# Patient Record
Sex: Male | Born: 1955 | Race: White | Hispanic: No | Marital: Single | Smoking: Former smoker
Health system: Southern US, Community
[De-identification: ages and names within clinical notes are randomized; demographics above are authoritative.]

## PROBLEM LIST (undated history)

## (undated) DIAGNOSIS — R12 Heartburn: Secondary | ICD-10-CM

## (undated) DIAGNOSIS — X58XXXA Exposure to other specified factors, initial encounter: Secondary | ICD-10-CM

## (undated) DIAGNOSIS — I10 Essential (primary) hypertension: Secondary | ICD-10-CM

## (undated) DIAGNOSIS — E785 Hyperlipidemia, unspecified: Secondary | ICD-10-CM

## (undated) HISTORY — DX: Heartburn: R12

## (undated) HISTORY — DX: Essential (primary) hypertension: I10

## (undated) HISTORY — DX: Hyperlipidemia, unspecified: E78.5

## (undated) HISTORY — DX: Exposure to other specified factors, initial encounter: X58.XXXA

---

## 2007-03-20 ENCOUNTER — Emergency Department: Payer: Self-pay | Admitting: Emergency Medicine

## 2010-12-25 ENCOUNTER — Ambulatory Visit: Payer: Self-pay | Admitting: Family Medicine

## 2011-01-01 ENCOUNTER — Ambulatory Visit: Payer: Self-pay | Admitting: Family Medicine

## 2011-09-04 IMAGING — US CV
1 series · 14 of 16 positions shown · non-contrast
Comparison: none

REASON FOR EXAM: syncope
COMMENTS:

[Series 1: cv · 0.07mm/px · 14 of 57 slices shown]
[im 1/57]
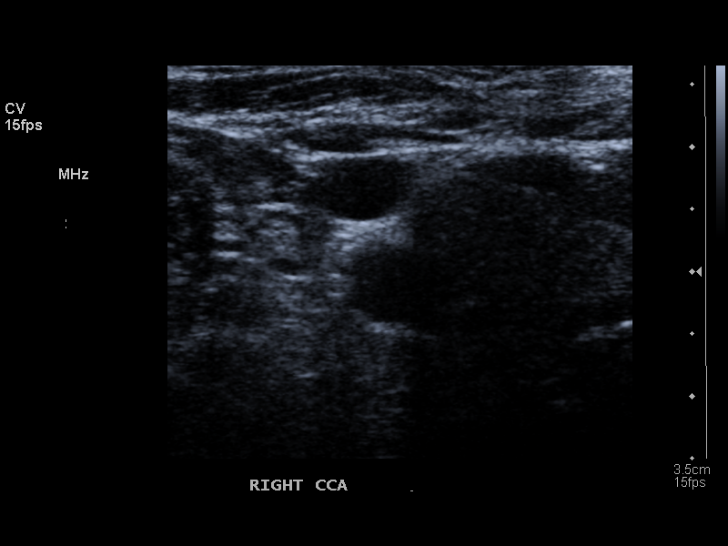
[im 4/57]
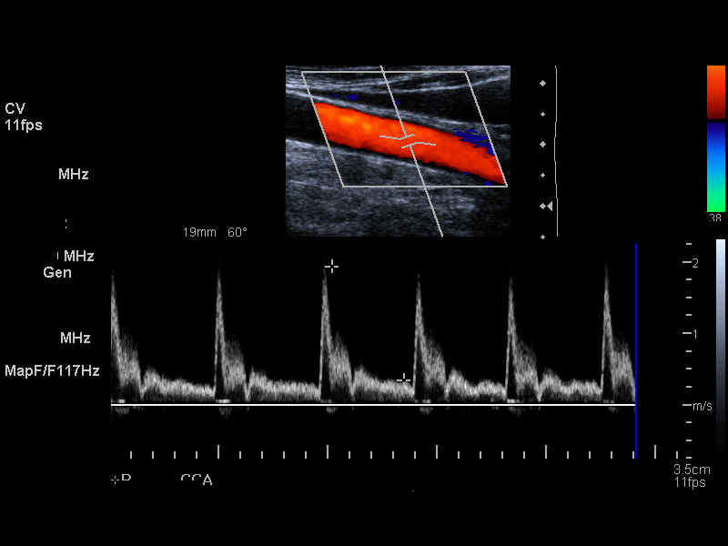
[im 8/57]
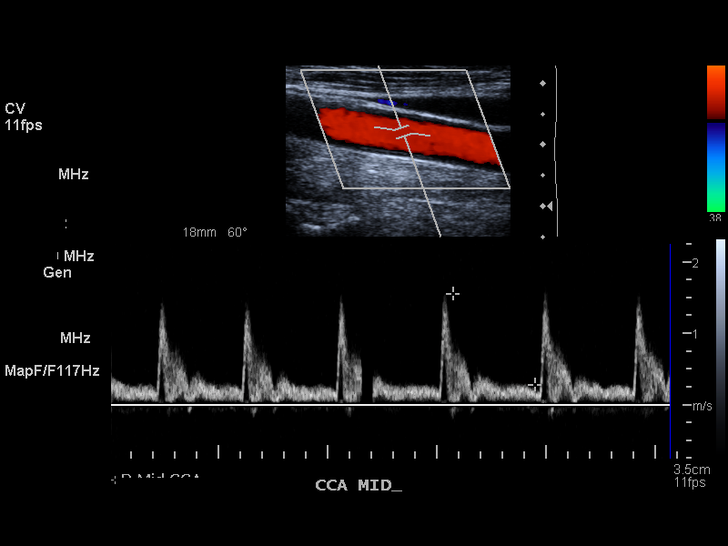
[im 15/57]
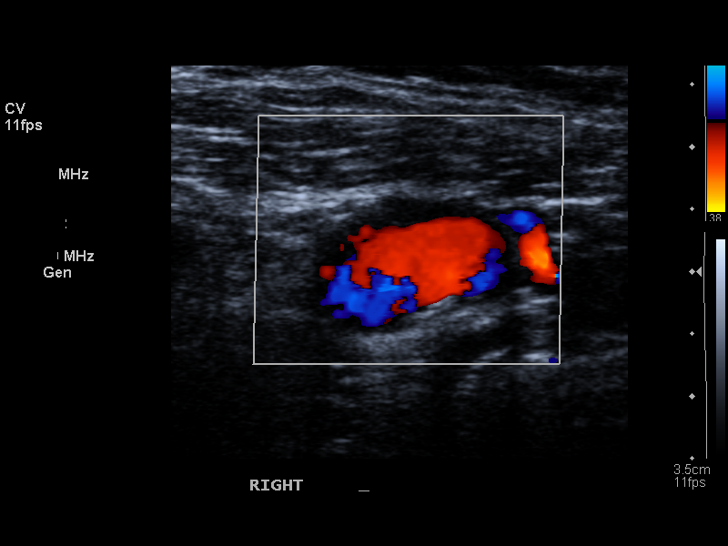
[im 19/57]
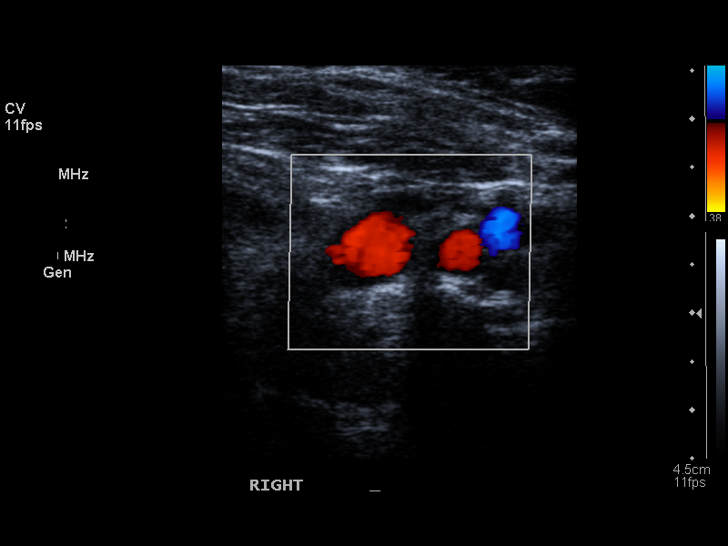
[im 23/57]
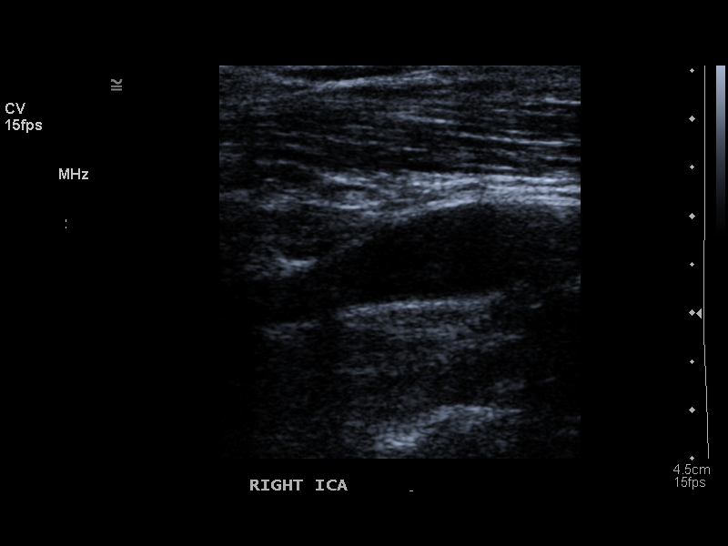
[im 27/57]
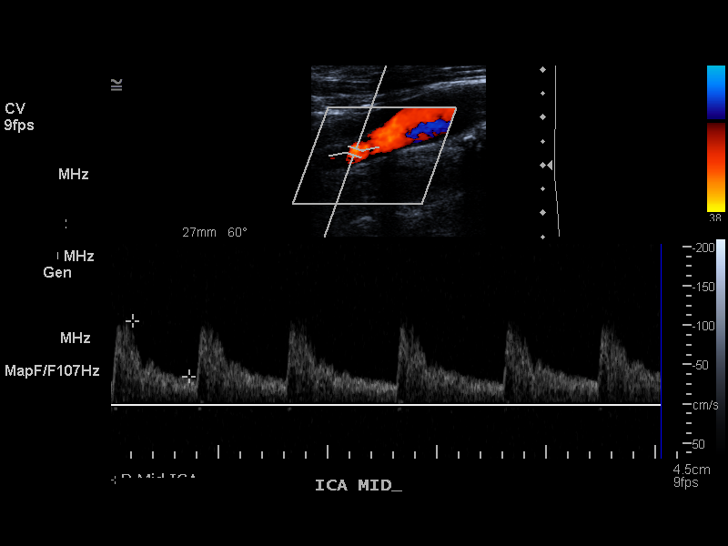
[im 30/57]
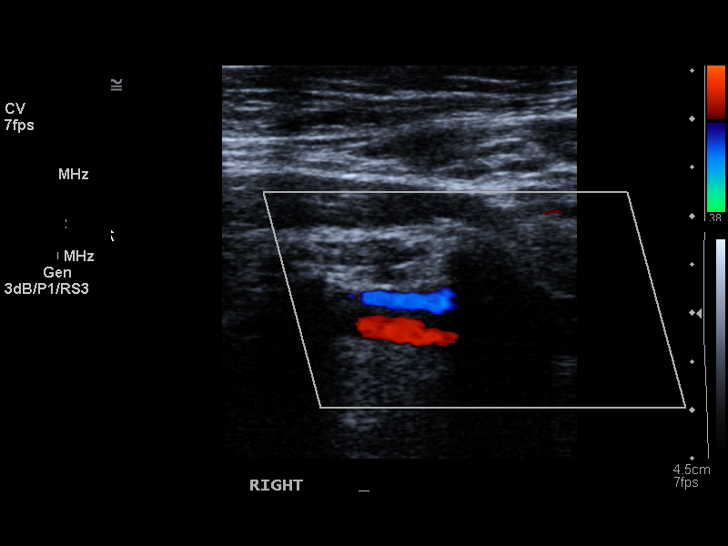
[im 34/57]
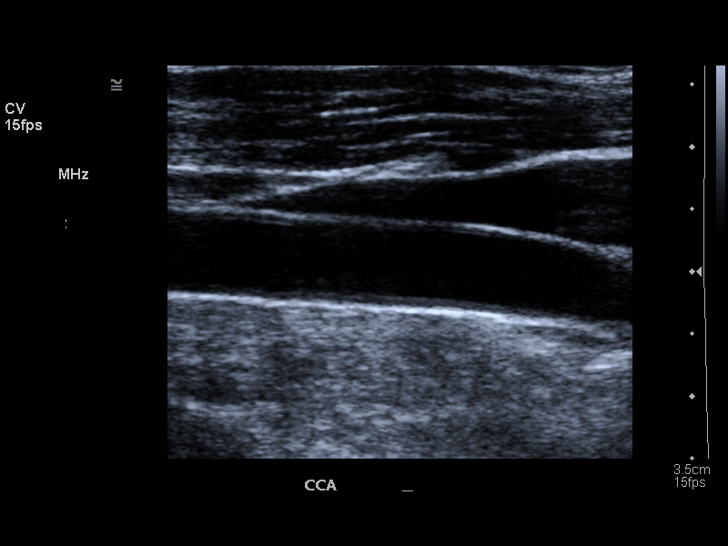
[im 38/57]
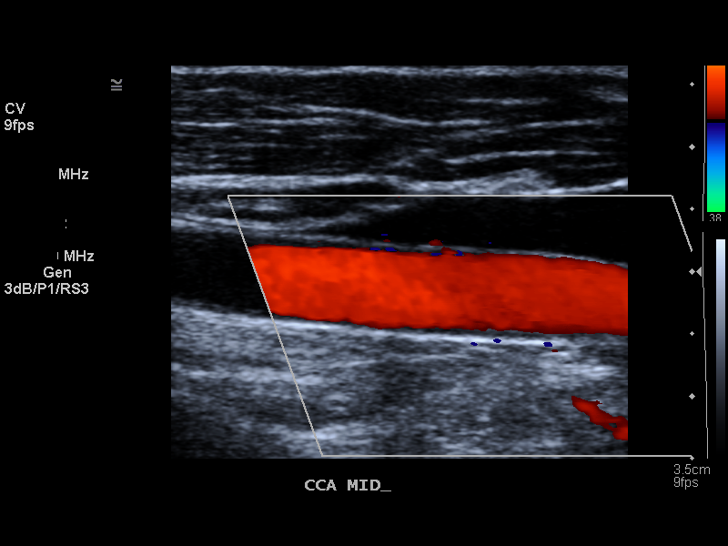
[im 45/57]
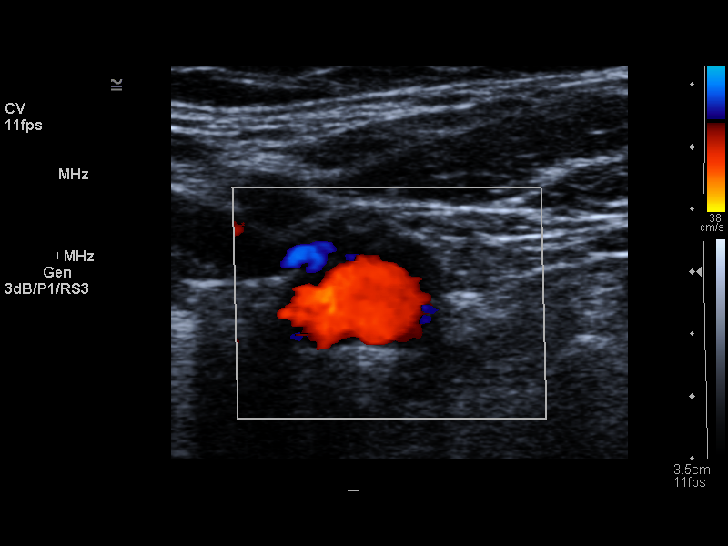
[im 49/57]
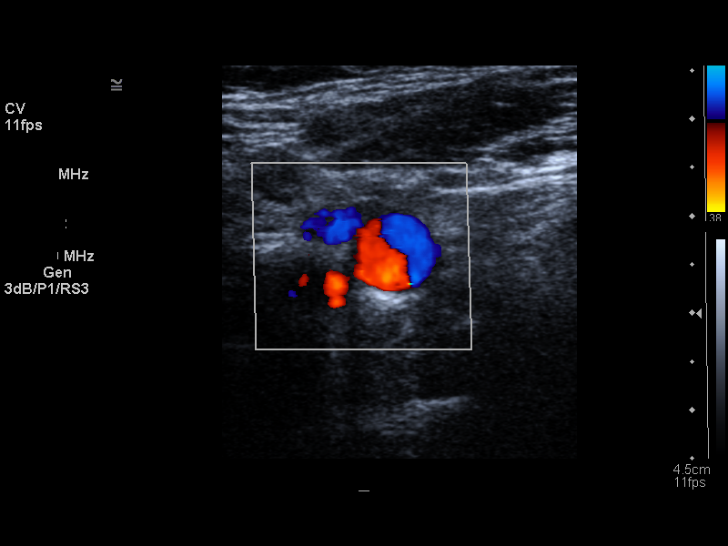
[im 53/57]
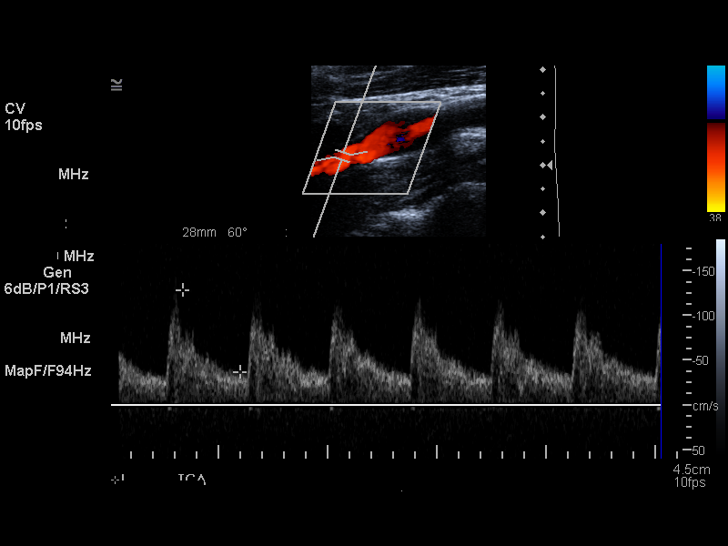
[im 57/57]
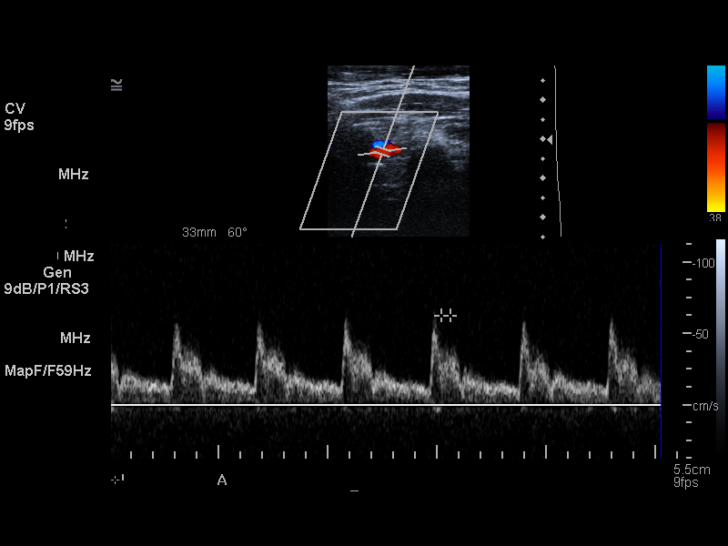

[14 of 16 positions shown; findings below may reference images not displayed]

PROCEDURE:     US  - US CAROTID DOPPLER BILATERAL  - December 25, 2010 [DATE]

RESULT:     There is slight calcific plaque formation at the carotid
bifurcation on the right. No plaque formation is seen on the left. On the
right, the peak right common carotid artery flow velocity measures 193 cm
per second and the peak right internal carotid artery flow velocity measures
133.2 cm per second. The ICA/CCA ratio is 0.69. On the left, the peak left
common carotid artery flow velocity measures 157.6 cm per second and the
peak left internal carotid artery flow velocity measures 128.5 cm per
second. The ICA/CCA ratio is 0.82. These values bilaterally are consistent
with the absence of hemodynamically significant stenosis.

There is observed antegrade flow in both vertebrals.
IMPRESSION: 1. No hemodynamically significant stenosis is identified on either side.
2. There is antegrade flow in both vertebrals.
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
## 2011-09-22 DIAGNOSIS — X58XXXA Exposure to other specified factors, initial encounter: Secondary | ICD-10-CM

## 2011-09-22 HISTORY — DX: Exposure to other specified factors, initial encounter: X58.XXXA

## 2015-04-22 ENCOUNTER — Encounter: Payer: Self-pay | Admitting: Family Medicine

## 2015-04-22 ENCOUNTER — Ambulatory Visit (INDEPENDENT_AMBULATORY_CARE_PROVIDER_SITE_OTHER): Payer: BLUE CROSS/BLUE SHIELD | Admitting: Family Medicine

## 2015-04-22 VITALS — BP 138/81 | HR 79 | Resp 16 | Ht 62.0 in | Wt 169.8 lb

## 2015-04-22 DIAGNOSIS — I152 Hypertension secondary to endocrine disorders: Secondary | ICD-10-CM | POA: Insufficient documentation

## 2015-04-22 DIAGNOSIS — E785 Hyperlipidemia, unspecified: Secondary | ICD-10-CM

## 2015-04-22 DIAGNOSIS — E1159 Type 2 diabetes mellitus with other circulatory complications: Secondary | ICD-10-CM | POA: Insufficient documentation

## 2015-04-22 DIAGNOSIS — R12 Heartburn: Secondary | ICD-10-CM | POA: Diagnosis not present

## 2015-04-22 DIAGNOSIS — I10 Essential (primary) hypertension: Secondary | ICD-10-CM

## 2015-04-22 DIAGNOSIS — E119 Type 2 diabetes mellitus without complications: Secondary | ICD-10-CM | POA: Diagnosis not present

## 2015-04-22 DIAGNOSIS — E1169 Type 2 diabetes mellitus with other specified complication: Secondary | ICD-10-CM | POA: Insufficient documentation

## 2015-04-22 MED ORDER — AMLODIPINE BESYLATE 5 MG PO TABS: 5.0000 mg | ORAL_TABLET | Freq: Every day | ORAL | Status: DC

## 2015-04-22 MED ORDER — BENAZEPRIL HCL 40 MG PO TABS: 40.0000 mg | ORAL_TABLET | Freq: Every day | ORAL | Status: DC

## 2015-04-22 MED ORDER — GEMFIBROZIL 600 MG PO TABS: 600.0000 mg | ORAL_TABLET | Freq: Two times a day (BID) | ORAL | Status: DC

## 2015-04-22 MED ORDER — ATORVASTATIN CALCIUM 10 MG PO TABS: 10.0000 mg | ORAL_TABLET | Freq: Every day | ORAL | Status: DC

## 2015-04-22 MED ORDER — METFORMIN HCL 1000 MG PO TABS
1000.0000 mg | ORAL_TABLET | Freq: Two times a day (BID) | ORAL | Status: DC
Start: 2015-04-22 — End: 2021-09-23

## 2015-04-22 NOTE — Progress Notes (Signed)
Name: Jeffrey Mcdonald   MRN: [id number]    DOB: Sep 16, 1956   Date:04/22/2015       Progress Note  Subjective  Chief Complaint  Chief Complaint  Patient presents with  . Establish Care  . Diabetes  . Hypertension  . Hyperlipidemia    HPI  Here to esatblish care.  Fpormer patient of Dr. Rhunette Croft.  Has HBP, DM, Elevated lipids.  Not sure when last A1c was, but thinks it was 6.3.  He is taking his meds. And has no c/o.  Past Medical History  Diagnosis Date  . Heartburn   . Hypertension   . Hyperlipidemia   . Accident 2013    Had accident after taking BP HCTZ     History reviewed. No pertinent past surgical history.  Family History  Problem Relation Age of Onset  . Hypertension Maternal Aunt   . Kidney disease Maternal Aunt     History   Social History  . Marital Status: Single    Spouse Name: N/A  . Number of Children: N/A  . Years of Education: N/A   Occupational History  . Not on file.   Social History Main Topics  . Smoking status: Former Smoker -- 1.00 packs/day    Types: Cigarettes    Quit date: 09/21/1976  . Smokeless tobacco: Never Used  . Alcohol Use: No  . Drug Use: No     Comment: [employer] as young child.   . Sexual Activity: Not on file   Other Topics Concern  . Not on file   Social History Narrative  . No narrative on file     Current outpatient prescriptions:  .  amLODipine (NORVASC) 5 MG tablet, TK 1 T PO D, Disp: , Rfl: 0 .  atorvastatin (LIPITOR) 10 MG tablet, TK 1 T PO HS, Disp: , Rfl: 0 .  benazepril (LOTENSIN) 40 MG tablet, , Disp: , Rfl: 0 .  Fish Oil-Cholecalciferol (FISH OIL + D3 PO), Take by mouth., Disp: , Rfl:  .  gemfibrozil (LOPID) 600 MG tablet, , Disp: , Rfl: 0 .  metFORMIN (GLUCOPHAGE) 1000 MG tablet, TK 1 T PO BID, Disp: , Rfl: 0 .  Vitamin D, Ergocalciferol, (DRISDOL) 50000 UNITS CAPS capsule, Take 50,000 Units by mouth every 7 (seven) days., Disp: , Rfl:   Allergies  Allergen Reactions  . Hydrochlorothiazide       Review of Systems  Constitutional: Negative for fever, chills, weight loss and malaise/fatigue.  HENT: Negative for nosebleeds.   Eyes: Negative for blurred vision and double vision.  Respiratory: Negative for cough, sputum production, shortness of breath and wheezing.   Cardiovascular: Negative for chest pain, palpitations, orthopnea, claudication and leg swelling.  Gastrointestinal: Negative for heartburn, nausea, vomiting, abdominal pain, diarrhea and blood in stool.  Genitourinary: Negative for dysuria, urgency and frequency.  Musculoskeletal: Negative for myalgias and joint pain.  Skin: Negative for rash.  Neurological: Negative for dizziness, sensory change, focal weakness, weakness and headaches.  Psychiatric/Behavioral: Negative for depression. The patient is not nervous/anxious.       Objective  Filed Vitals:   04/22/15 1503  BP: 138/81  Pulse: 79  Resp: 16  Height: 5\' 2"  (1.575 m)  Weight: 169 lb 12.8 oz (77.021 kg)    Physical Exam  Constitutional: He is oriented to person, place, and time and well-developed, well-nourished, and in no distress.  HENT:  Head: Normocephalic and atraumatic.  Eyes: Conjunctivae are normal. Pupils are equal, round, and reactive to light.  No scleral icterus.  Neck: Normal range of motion. Neck supple.  Cardiovascular: Normal rate, regular rhythm, normal heart sounds and intact distal pulses.  Exam reveals no gallop and no friction rub.   No murmur heard. Pulmonary/Chest: Effort normal and breath sounds normal. No respiratory distress. He has no wheezes. He has no rales.  Abdominal: Soft. Bowel sounds are normal. He exhibits no distension and no mass. There is no tenderness.  Musculoskeletal: He exhibits no edema.  Lymphadenopathy:    He has no cervical adenopathy.  Neurological: He is alert and oriented to person, place, and time.  Vitals reviewed.         Assessment & Plan  Problem List Items Addressed This Visit       Cardiovascular and Mediastinum   Hypertension - Primary   Relevant Medications   amLODipine (NORVASC) 5 MG tablet   atorvastatin (LIPITOR) 10 MG tablet   benazepril (LOTENSIN) 40 MG tablet   gemfibrozil (LOPID) 600 MG tablet     Endocrine   Diabetes   Relevant Medications   atorvastatin (LIPITOR) 10 MG tablet   benazepril (LOTENSIN) 40 MG tablet   metFORMIN (GLUCOPHAGE) 1000 MG tablet     Other   Hyperlipidemia   Relevant Medications   amLODipine (NORVASC) 5 MG tablet   atorvastatin (LIPITOR) 10 MG tablet   benazepril (LOTENSIN) 40 MG tablet   gemfibrozil (LOPID) 600 MG tablet   Heartburn      Meds ordered this encounter  Medications  . amLODipine (NORVASC) 5 MG tablet    Sig: TK 1 T PO D    Refill:  0  . atorvastatin (LIPITOR) 10 MG tablet    Sig: TK 1 T PO HS    Refill:  0  . benazepril (LOTENSIN) 40 MG tablet    Sig:     Refill:  0  . gemfibrozil (LOPID) 600 MG tablet    Sig:     Refill:  0  . metFORMIN (GLUCOPHAGE) 1000 MG tablet    Sig: TK 1 T PO BID    Refill:  0  . Fish Oil-Cholecalciferol (FISH OIL + D3 PO)    Sig: Take by mouth.  . Vitamin D, Ergocalciferol, (DRISDOL) 50000 UNITS CAPS capsule    Sig: Take 50,000 Units by mouth every 7 (seven) days.

## 2015-04-24 LAB — COMPREHENSIVE METABOLIC PANEL
A/G RATIO: 1.9 (ref 1.1–2.5)
ALT: 32 IU/L (ref 0–44)
AST: 28 IU/L (ref 0–40)
Albumin: 4.7 g/dL (ref 3.5–5.5)
Alkaline Phosphatase: 52 IU/L (ref 39–117)
BUN / CREAT RATIO: 20 (ref 9–20)
BUN: 19 mg/dL (ref 6–24)
Bilirubin Total: <0.2 mg/dL (ref 0.0–1.2)
CHLORIDE: 99 mmol/L (ref 97–108)
CO2: 16 mmol/L — ABNORMAL LOW (ref 18–29)
Calcium: 9.9 mg/dL (ref 8.7–10.2)
Creatinine, Ser: 0.94 mg/dL (ref 0.76–1.27)
GFR calc Af Amer: 102 mL/min/{1.73_m2} (ref >59)
GFR calc non Af Amer: 88 mL/min/{1.73_m2} (ref >59)
GLOBULIN, TOTAL: 2.5 g/dL (ref 1.5–4.5)
Glucose: 190 mg/dL — ABNORMAL HIGH (ref 65–99)
Potassium: 4.9 mmol/L (ref 3.5–5.2)
Sodium: 139 mmol/L (ref 134–144)
TOTAL PROTEIN: 7.2 g/dL (ref 6.0–8.5)

## 2015-04-24 LAB — CBC WITH DIFFERENTIAL/PLATELET
BASOS: 1 %
Basophils Absolute: 0 10*3/uL (ref 0.0–0.2)
EOS (ABSOLUTE): 0.1 10*3/uL (ref 0.0–0.4)
EOS: 3 %
HEMATOCRIT: 41.9 % (ref 37.5–51.0)
Hemoglobin: 14.6 g/dL (ref 12.6–17.7)
IMMATURE GRANULOCYTES: 0 %
Immature Grans (Abs): 0 10*3/uL (ref 0.0–0.1)
LYMPHS: 31 %
Lymphocytes Absolute: 1.3 10*3/uL (ref 0.7–3.1)
MCH: 31.8 pg (ref 26.6–33.0)
MCHC: 34.8 g/dL (ref 31.5–35.7)
MCV: 91 fL (ref 79–97)
Monocytes Absolute: 0.3 10*3/uL (ref 0.1–0.9)
Monocytes: 8 %
Neutrophils Absolute: 2.4 10*3/uL (ref 1.4–7.0)
Neutrophils: 57 %
PLATELETS: 183 10*3/uL (ref 150–379)
RBC: 4.59 x10E6/uL (ref 4.14–5.80)
RDW: 14.1 % (ref 12.3–15.4)
WBC: 4.2 10*3/uL (ref 3.4–10.8)

## 2015-04-24 LAB — HEMOGLOBIN A1C
Est. average glucose Bld gHb Est-mCnc: 154 mg/dL
Hgb A1c MFr Bld: 7 % — ABNORMAL HIGH (ref 4.8–5.6)

## 2015-04-24 LAB — LIPID PANEL
CHOL/HDL RATIO: 7.6 ratio — AB (ref 0.0–5.0)
Cholesterol, Total: 159 mg/dL (ref 100–199)
HDL: 21 mg/dL — AB (ref >39)
Triglycerides: 848 mg/dL (ref 0–149)

## 2015-04-25 ENCOUNTER — Telehealth: Payer: Self-pay

## 2015-05-01 NOTE — Telephone Encounter (Signed)
Patient aware.[facility]

## 2015-05-01 NOTE — Progress Notes (Signed)
CMP ok except for elevated sugar (expected).  Rest of labs already discussed with patient.-jh

## 2015-07-18 ENCOUNTER — Other Ambulatory Visit: Payer: Self-pay | Admitting: Family Medicine

## 2015-07-18 DIAGNOSIS — E785 Hyperlipidemia, unspecified: Secondary | ICD-10-CM

## 2015-07-18 NOTE — Telephone Encounter (Signed)
Pt needs a refill on gemfibrozil 600 mg 1 tab twice daily.  His call call back number is [phone]

## 2015-07-19 MED ORDER — GEMFIBROZIL 600 MG PO TABS: 600.0000 mg | ORAL_TABLET | Freq: Two times a day (BID) | ORAL | Status: DC

## 2015-08-02 ENCOUNTER — Ambulatory Visit: Payer: BLUE CROSS/BLUE SHIELD | Admitting: Family Medicine

## 2015-10-29 ENCOUNTER — Ambulatory Visit: Payer: BLUE CROSS/BLUE SHIELD | Admitting: Family Medicine

## 2016-01-29 ENCOUNTER — Ambulatory Visit: Payer: Self-pay | Admitting: Podiatry

## 2016-02-26 ENCOUNTER — Ambulatory Visit (INDEPENDENT_AMBULATORY_CARE_PROVIDER_SITE_OTHER): Payer: BLUE CROSS/BLUE SHIELD | Admitting: Podiatry

## 2016-02-26 ENCOUNTER — Encounter: Payer: Self-pay | Admitting: Podiatry

## 2016-02-26 ENCOUNTER — Ambulatory Visit (INDEPENDENT_AMBULATORY_CARE_PROVIDER_SITE_OTHER): Payer: BLUE CROSS/BLUE SHIELD

## 2016-02-26 VITALS — BP 158/95 | HR 68 | Resp 16

## 2016-02-26 DIAGNOSIS — M722 Plantar fascial fibromatosis: Secondary | ICD-10-CM

## 2016-02-26 MED ORDER — MELOXICAM 15 MG PO TABS: 15.0000 mg | ORAL_TABLET | Freq: Every day | ORAL | Status: DC

## 2016-02-26 NOTE — Progress Notes (Signed)
   Subjective:    Patient ID: Jeffrey Mcdonald, male    DOB: Aug 01, 1956, 60 y.o.   MRN: [id number]  HPI: He presents today with a three-month history of pain to the left heel.    Review of Systems  All other systems reviewed and are negative.      Objective:   Physical Exam: Vital signs are stable he's alert and oriented 3 pulses are palpable. Neurologic sensorium is intact. Deep tendon reflexes are intact. Muscle strength +5 over 5 dorsiflexion plantar flexors and inverters everters onto the musculature is intact. Orthopedic evaluation and stress all joints distal to the ankle for range of motion or crepitation. Pain on palpation medial continue to go the left heel. Radiographs do demonstrates a large plantar distally oriented calcaneal heel spur with soft tissue increase in density at the plantar fascia calcaneal insertion site. No open lesions or wounds are noted on cutaneous evaluation.        Assessment & Plan:  Plantar fasciitis left foot.  Plan: I injected left heel today with Kenalog and local anesthetic placed in the plantar fascia brace and a night splint. Discussed appropriate shoe gear stretching of sizes and ice therapy also prescribed a prescription for meloxicam.

## 2016-04-08 ENCOUNTER — Ambulatory Visit: Payer: BLUE CROSS/BLUE SHIELD | Admitting: Podiatry

## 2016-09-18 ENCOUNTER — Emergency Department
Admission: EM | Admit: 2016-09-18 | Discharge: 2016-09-18 | Disposition: A | Discharge status: Home / Self Care | Payer: BLUE CROSS/BLUE SHIELD | Attending: Emergency Medicine | Admitting: Emergency Medicine

## 2016-09-18 ENCOUNTER — Encounter: Payer: Self-pay | Admitting: Emergency Medicine

## 2016-09-18 DIAGNOSIS — Z7982 Long term (current) use of aspirin: Secondary | ICD-10-CM | POA: Insufficient documentation

## 2016-09-18 DIAGNOSIS — E860 Dehydration: Secondary | ICD-10-CM | POA: Diagnosis not present

## 2016-09-18 DIAGNOSIS — Z79899 Other long term (current) drug therapy: Secondary | ICD-10-CM | POA: Diagnosis not present

## 2016-09-18 DIAGNOSIS — I10 Essential (primary) hypertension: Secondary | ICD-10-CM | POA: Insufficient documentation

## 2016-09-18 DIAGNOSIS — E119 Type 2 diabetes mellitus without complications: Secondary | ICD-10-CM | POA: Diagnosis not present

## 2016-09-18 DIAGNOSIS — J111 Influenza due to unidentified influenza virus with other respiratory manifestations: Secondary | ICD-10-CM

## 2016-09-18 DIAGNOSIS — R197 Diarrhea, unspecified: Secondary | ICD-10-CM

## 2016-09-18 DIAGNOSIS — K529 Noninfective gastroenteritis and colitis, unspecified: Secondary | ICD-10-CM

## 2016-09-18 DIAGNOSIS — R69 Illness, unspecified: Secondary | ICD-10-CM

## 2016-09-18 DIAGNOSIS — R112 Nausea with vomiting, unspecified: Secondary | ICD-10-CM | POA: Diagnosis present

## 2016-09-18 DIAGNOSIS — Z7984 Long term (current) use of oral hypoglycemic drugs: Secondary | ICD-10-CM | POA: Diagnosis not present

## 2016-09-18 DIAGNOSIS — Z87891 Personal history of nicotine dependence: Secondary | ICD-10-CM | POA: Insufficient documentation

## 2016-09-18 LAB — CBC
HEMATOCRIT: 44.9 % (ref 40.0–52.0)
Hemoglobin: 15.6 g/dL (ref 13.0–18.0)
MCH: 31.3 pg (ref 26.0–34.0)
MCHC: 34.7 g/dL (ref 32.0–36.0)
MCV: 90.3 fL (ref 80.0–100.0)
PLATELETS: 134 10*3/uL — AB (ref 150–440)
RBC: 4.97 MIL/uL (ref 4.40–5.90)
RDW: 14.1 % (ref 11.5–14.5)
WBC: 8.6 10*3/uL (ref 3.8–10.6)

## 2016-09-18 LAB — COMPREHENSIVE METABOLIC PANEL
ALT: 34 U/L (ref 17–63)
AST: 40 U/L (ref 15–41)
Albumin: 4.1 g/dL (ref 3.5–5.0)
Alkaline Phosphatase: 26 U/L — ABNORMAL LOW (ref 38–126)
Anion gap: 11 (ref 5–15)
BUN: 32 mg/dL — ABNORMAL HIGH (ref 6–20)
CHLORIDE: 106 mmol/L (ref 101–111)
CO2: 18 mmol/L — AB (ref 22–32)
CREATININE: 0.96 mg/dL (ref 0.61–1.24)
Calcium: 8.8 mg/dL — ABNORMAL LOW (ref 8.9–10.3)
GFR calc non Af Amer: >60 mL/min (ref >60)
Glucose, Bld: 195 mg/dL — ABNORMAL HIGH (ref 65–99)
POTASSIUM: 3.9 mmol/L (ref 3.5–5.1)
SODIUM: 135 mmol/L (ref 135–145)
Total Bilirubin: 1.1 mg/dL (ref 0.3–1.2)
Total Protein: 7.3 g/dL (ref 6.5–8.1)

## 2016-09-18 LAB — LIPASE, BLOOD: LIPASE: 29 U/L (ref 11–51)

## 2016-09-18 MED ORDER — ONDANSETRON 8 MG PO TBDP: 8.0000 mg | ORAL_TABLET | Freq: Three times a day (TID) | ORAL | 0 refills | Status: DC | PRN

## 2016-09-18 MED ORDER — ACETAMINOPHEN 500 MG PO TABS
ORAL_TABLET | ORAL | Status: AC
  Administered 2016-09-18: 1000 mg via ORAL
  Filled 2016-09-18: qty 2

## 2016-09-18 MED ORDER — SODIUM CHLORIDE 0.9 % IV BOLUS (SEPSIS)
1000.0000 mL | Freq: Once | INTRAVENOUS | Status: AC
  Administered 2016-09-18: 1000 mL via INTRAVENOUS

## 2016-09-18 MED ORDER — LOPERAMIDE HCL 2 MG PO CAPS
ORAL_CAPSULE | ORAL | Status: AC
  Administered 2016-09-18: 4 mg via ORAL
  Filled 2016-09-18: qty 2

## 2016-09-18 MED ORDER — FAMOTIDINE IN NACL 20-0.9 MG/50ML-% IV SOLN
20.0000 mg | Freq: Once | INTRAVENOUS | Status: AC
  Administered 2016-09-18: 20 mg via INTRAVENOUS
  Filled 2016-09-18: qty 50

## 2016-09-18 MED ORDER — LOPERAMIDE HCL 2 MG PO TABS: 4.0000 mg | ORAL_TABLET | Freq: Four times a day (QID) | ORAL | 0 refills | Status: DC | PRN

## 2016-09-18 MED ORDER — LOPERAMIDE HCL 2 MG PO CAPS
4.0000 mg | ORAL_CAPSULE | Freq: Once | ORAL | Status: AC
  Administered 2016-09-18: 4 mg via ORAL

## 2016-09-18 MED ORDER — FAMOTIDINE 20 MG PO TABS: 20.0000 mg | ORAL_TABLET | Freq: Two times a day (BID) | ORAL | 0 refills | Status: DC

## 2016-09-18 MED ORDER — ONDANSETRON HCL 4 MG/2ML IJ SOLN
4.0000 mg | Freq: Once | INTRAMUSCULAR | Status: AC
  Administered 2016-09-18: 4 mg via INTRAVENOUS
  Filled 2016-09-18: qty 2

## 2016-09-18 MED ORDER — ACETAMINOPHEN 500 MG PO TABS
1000.0000 mg | ORAL_TABLET | Freq: Once | ORAL | Status: AC
  Administered 2016-09-18: 1000 mg via ORAL

## 2016-09-18 NOTE — ED Triage Notes (Signed)
Patient presents to the ED with nausea and vomiting since 7am.  Patient reports vomiting more than 10 times.  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  Patient denies diarrhea.  Patient is very talkative.  No obvious distress at this time.  Denies abdominal pain.  Denies shortness of breath.

## 2016-09-18 NOTE — ED Provider Notes (Signed)
[facility] Emergency Department Provider Note  ____________________________________________  Time seen: Approximately 6:26 PM  I have reviewed the triage vital signs and the nursing notes.   HISTORY  Chief Complaint Emesis    HPI Dayton ScrapeKen B Zaborowski is a 60 y.o. male who complains of nausea vomiting since this morning. He is reports that he has not yet had any diarrhea. He also does have diffuse body aches and generalized abdominal discomfort. No chest pain shortness of breath. Chills but no fever. No dysuria.   Past Medical History:  Diagnosis Date  . Accident 2013   Had accident after taking BP HCTZ   . Heartburn   . Hyperlipidemia   . Hypertension      Patient Active Problem List   Diagnosis Date Noted  . Hypertension 04/22/2015  . Hyperlipidemia 04/22/2015  . Heartburn 04/22/2015  . Diabetes (HCC) 04/22/2015     History reviewed. No pertinent surgical history.   Prior to Admission medications   Medication Sig Start Date End Date Taking? Authorizing Provider  amLODipine (NORVASC) 5 MG tablet Take 1 tablet (5 mg total) by mouth daily. 04/22/15   Janeann ForehandJames H Hawkins Jr., MD  aspirin 81 MG tablet Take 81 mg by mouth daily.    Historical Provider, MD  atorvastatin (LIPITOR) 10 MG tablet Take 1 tablet (10 mg total) by mouth daily at 6 PM. 04/22/15   Janeann ForehandJames H Hawkins Jr., MD  benazepril (LOTENSIN) 40 MG tablet Take 1 tablet (40 mg total) by mouth daily. 04/22/15   Janeann ForehandJames H Hawkins Jr., MD  famotidine (PEPCID) 20 MG tablet Take 1 tablet (20 mg total) by mouth 2 (two) times daily. 09/18/16   Sharman CheekPhillip Kehinde Totzke, MD  Fish Oil-Cholecalciferol (FISH OIL + D3 PO) Take by mouth.    Historical Provider, MD  gemfibrozil (LOPID) 600 MG tablet Take 1 tablet (600 mg total) by mouth 2 (two) times daily before a meal. 07/19/15   Janeann ForehandJames H Hawkins Jr., MD  loperamide (IMODIUM A-D) 2 MG tablet Take 2 tablets (4 mg total) by mouth 4 (four) times daily as needed for diarrhea or loose  stools. 09/18/16   Sharman CheekPhillip Kaisley Stiverson, MD  meloxicam (MOBIC) 15 MG tablet Take 1 tablet (15 mg total) by mouth daily. 02/26/16   Max T Hyatt, DPM  metFORMIN (GLUCOPHAGE) 1000 MG tablet Take 1 tablet (1,000 mg total) by mouth 2 (two) times daily with a meal. 04/22/15   Janeann ForehandJames H Hawkins Jr., MD  ondansetron (ZOFRAN ODT) 8 MG disintegrating tablet Take 1 tablet (8 mg total) by mouth every 8 (eight) hours as needed for nausea or vomiting. 09/18/16   Sharman CheekPhillip Dennisse Swader, MD  Vitamin D, Ergocalciferol, (DRISDOL) 50000 UNITS CAPS capsule Take 50,000 Units by mouth every 7 (seven) days.    Historical Provider, MD     Allergies Hydrochlorothiazide   Family History  Problem Relation Age of Onset  . Hypertension Maternal Aunt   . Kidney disease Maternal Aunt     Social History Social History  Substance Use Topics  . Smoking status: Former Smoker    Packs/day: 1.00    Types: Cigarettes    Quit date: 09/21/1976  . Smokeless tobacco: Never Used  . Alcohol use No    Review of Systems  Constitutional:   No fever Positive chills.  ENT:   No sore throat. No rhinorrhea. Cardiovascular:   No chest pain. Respiratory:   No dyspnea or cough. Gastrointestinal:   Positive generalized abdominal pain or vomiting. No diarrhea.  Genitourinary:  Negative for dysuria or difficulty urinating. Musculoskeletal:   Negative for focal pain or swelling. Positive diffuse myalgia Neurological:   Negative for headaches 10-point ROS otherwise negative.  ____________________________________________   PHYSICAL EXAM:  VITAL SIGNS: ED Triage Vitals  Enc Vitals Group     BP 09/18/16 1433 124/84     Pulse Rate 09/18/16 1433 (!) 140     Resp 09/18/16 1433 18     Temp 09/18/16 1433 99.4 F (37.4 C)     Temp Source 09/18/16 1433 Oral     SpO2 09/18/16 1433 95 %     Weight 09/18/16 1603 170 lb (77.1 kg)     Height 09/18/16 1603 5\' 1"  (1.549 m)     Head Circumference --      Peak Flow --      Pain Score 09/18/16 1603 5      Pain Loc --      Pain Edu? --      Excl. in GC? --     Vital signs reviewed, nursing assessments reviewed.   Constitutional:   Alert and oriented. Well appearing and in no distress. Eyes:   No scleral icterus. No conjunctival pallor. PERRL. EOMI.  No nystagmus. ENT   Head:   Normocephalic and atraumatic.   Nose:   No congestion/rhinnorhea. No septal hematoma   Mouth/Throat:   Dry mucous membrane, no pharyngeal erythema. No peritonsillar mass.    Neck:   No stridor. No SubQ emphysema. No meningismus. Hematological/Lymphatic/Immunilogical:   No cervical lymphadenopathy. Cardiovascular:   Tachycardia heart rate 120. Symmetric bilateral radial and DP pulses.  No murmurs.  Respiratory:   Normal respiratory effort without tachypnea nor retractions. Breath sounds are clear and equal bilaterally. No wheezes/rales/rhonchi. Gastrointestinal:   Soft with mild generalized tenderness, no focal tenderness.. Non distended. There is no CVA tenderness.  No rebound, rigidity, or guarding. Genitourinary:   deferred Musculoskeletal:   Nontender with normal range of motion in all extremities. No joint effusions.  No lower extremity tenderness.  No edema. Neurologic:   Normal speech and language.  CN 2-10 normal. Motor grossly intact. No gross focal neurologic deficits are appreciated.  Skin:    Skin is warm, dry and intact. No rash noted.  No petechiae, purpura, or bullae.  ____________________________________________    LABS (pertinent positives/negatives) (all labs ordered are listed, but only abnormal results are displayed) Labs Reviewed  COMPREHENSIVE METABOLIC PANEL - Abnormal; Notable for the following:       Result Value   CO2 18 (*)    Glucose, Bld 195 (*)    BUN 32 (*)    Calcium 8.8 (*)    Alkaline Phosphatase 26 (*)    All other components within normal limits  CBC - Abnormal; Notable for the following:    Platelets 134 (*)    All other components within normal  limits  LIPASE, BLOOD  URINALYSIS, COMPLETE (UACMP) WITH MICROSCOPIC   ____________________________________________   EKG  Interpreted by me Sinus tachycardia rate 133, normal axis and intervals. Normal QRS ST segments and T waves.  ____________________________________________    RADIOLOGY    ____________________________________________   PROCEDURES Procedures  ____________________________________________   INITIAL IMPRESSION / ASSESSMENT AND PLAN / ED COURSE  Pertinent labs & imaging results that were available during my care of the patient were reviewed by me and considered in my medical decision making (see chart for details).  Patient well appearing no acute distress. Presents with tachycardia and low-grade fever with vomiting, and during  the ED treatment developed diarrhea,, consistent with dehydration and clinical scenario of influenza-like illness with gastroenteritis. Vital signs unremarkable. After IV fluids, heart rate is improved from 140-105. Patient tolerating oral intake. Nausea vomiting and diarrhea controlled with oral medications. We'll discharge him follow-up with primary care. Considering the patient's symptoms, medical history, and physical examination today, I have low suspicion for cholecystitis or biliary pathology, pancreatitis, perforation or bowel obstruction, hernia, intra-abdominal abscess, AAA or dissection, volvulus or intussusception, mesenteric ischemia, or appendicitis.       Clinical Course as of Sep 18 1825  Fri Sep 18, 2016  1757 HR 105. Feeling better. Improving with IVF. Check po tolerance, plan to DC home.   [PS]    Clinical Course User Index [PS] Sharman Cheek, MD   ____________________________________________   FINAL CLINICAL IMPRESSION(S) / ED DIAGNOSES  Final diagnoses:  Nausea vomiting and diarrhea  Gastroenteritis  Influenza-like illness  Dehydration      New Prescriptions   FAMOTIDINE (PEPCID) 20 MG TABLET     Take 1 tablet (20 mg total) by mouth 2 (two) times daily.   LOPERAMIDE (IMODIUM A-D) 2 MG TABLET    Take 2 tablets (4 mg total) by mouth 4 (four) times daily as needed for diarrhea or loose stools.   ONDANSETRON (ZOFRAN ODT) 8 MG DISINTEGRATING TABLET    Take 1 tablet (8 mg total) by mouth every 8 (eight) hours as needed for nausea or vomiting.     Portions of this note were generated with dragon dictation software. Dictation errors may occur despite best attempts at proofreading.    Sharman Cheek, MD 09/18/16 [phone]

## 2016-11-25 ENCOUNTER — Ambulatory Visit (INDEPENDENT_AMBULATORY_CARE_PROVIDER_SITE_OTHER): Payer: BLUE CROSS/BLUE SHIELD | Admitting: Podiatry

## 2016-11-25 ENCOUNTER — Encounter: Payer: Self-pay | Admitting: Podiatry

## 2016-11-25 DIAGNOSIS — M722 Plantar fascial fibromatosis: Secondary | ICD-10-CM

## 2016-11-25 MED ORDER — MELOXICAM 15 MG PO TABS: 15.0000 mg | ORAL_TABLET | Freq: Every day | ORAL | 3 refills | Status: DC

## 2016-11-25 NOTE — Progress Notes (Signed)
He presents today for follow-up of plantar fasciitis to his left heel. He states that having been back because a loss May insurance. In my foot is started hurting again. He states that the meloxicam really helped.  Objective: Vital signs are stable alert and oriented 3. Pulses are palpable. He has pain on palpation medially continue tubercle of the left heel.  Assessment: Plantar fasciitis left foot.  Plan: He will continue his night splint that he has a home and I injected his left heel today. I also represcribed his meloxicam. Follow up with him in 1 month

## 2016-12-28 ENCOUNTER — Ambulatory Visit: Payer: BLUE CROSS/BLUE SHIELD | Admitting: Podiatry

## 2016-12-30 ENCOUNTER — Ambulatory Visit (INDEPENDENT_AMBULATORY_CARE_PROVIDER_SITE_OTHER): Payer: BLUE CROSS/BLUE SHIELD | Admitting: Podiatry

## 2016-12-30 ENCOUNTER — Encounter: Payer: Self-pay | Admitting: Podiatry

## 2016-12-30 DIAGNOSIS — M722 Plantar fascial fibromatosis: Secondary | ICD-10-CM | POA: Diagnosis not present

## 2016-12-30 NOTE — Progress Notes (Signed)
He presents today for follow-up of his heel. He states that on doing great have absolutely no pain.  Objective: Vital signs are stable he's alert and oriented 3 no pain on palpation make a tube of the left heel.  Assessment: Pain in limb as resolved plantar fasciitis.  Plan: Follow up with me on an as-needed basis.

## 2017-04-07 ENCOUNTER — Ambulatory Visit (INDEPENDENT_AMBULATORY_CARE_PROVIDER_SITE_OTHER): Payer: BLUE CROSS/BLUE SHIELD | Admitting: Podiatry

## 2017-04-07 ENCOUNTER — Encounter: Payer: Self-pay | Admitting: Podiatry

## 2017-04-07 DIAGNOSIS — M722 Plantar fascial fibromatosis: Secondary | ICD-10-CM | POA: Diagnosis not present

## 2017-04-07 MED ORDER — MELOXICAM 15 MG PO TABS: 15.0000 mg | ORAL_TABLET | Freq: Every day | ORAL | 3 refills | Status: DC

## 2017-04-07 NOTE — Progress Notes (Signed)
He presents today for follow-up of his plantar fasciitis his left heel. He states that he is doing quite well like to have a refill of meloxicam but his heel just flared up.  Objective: Vital signs stable he is alert and oriented 3 pulses are palpable. Pain on palpation be continued to the left heel.  Assessment: Plantar fasciitis left.  Plan: Inject his left heel today with prolonged local anesthetic without complications also is renewed his meloxicam.

## 2017-06-09 ENCOUNTER — Ambulatory Visit: Payer: BLUE CROSS/BLUE SHIELD | Admitting: Podiatry

## 2017-10-20 ENCOUNTER — Ambulatory Visit: Payer: BLUE CROSS/BLUE SHIELD | Admitting: Podiatry

## 2019-01-05 ENCOUNTER — Ambulatory Visit: Payer: BLUE CROSS/BLUE SHIELD | Admitting: Family Medicine

## 2019-09-04 DIAGNOSIS — M65312 Trigger thumb, left thumb: Secondary | ICD-10-CM | POA: Insufficient documentation

## 2020-04-05 DIAGNOSIS — R42 Dizziness and giddiness: Secondary | ICD-10-CM | POA: Insufficient documentation

## 2021-02-10 ENCOUNTER — Ambulatory Visit: Payer: BLUE CROSS/BLUE SHIELD | Admitting: Family Medicine

## 2021-02-18 ENCOUNTER — Ambulatory Visit: Payer: Medicare Other | Admitting: Family Medicine

## 2021-02-18 ENCOUNTER — Encounter: Payer: Self-pay | Admitting: Family Medicine

## 2021-02-18 ENCOUNTER — Other Ambulatory Visit: Payer: Self-pay

## 2021-02-18 VITALS — BP 140/69 | HR 88 | Temp 98.6°F | Resp 16 | Ht 61.5 in | Wt 167.0 lb

## 2021-02-18 DIAGNOSIS — I10 Essential (primary) hypertension: Secondary | ICD-10-CM | POA: Diagnosis not present

## 2021-02-18 DIAGNOSIS — Z7689 Persons encountering health services in other specified circumstances: Secondary | ICD-10-CM

## 2021-02-18 DIAGNOSIS — E785 Hyperlipidemia, unspecified: Secondary | ICD-10-CM

## 2021-02-18 DIAGNOSIS — E119 Type 2 diabetes mellitus without complications: Secondary | ICD-10-CM

## 2021-02-18 MED ORDER — AMLODIPINE BESYLATE 5 MG PO TABS: 5.0000 mg | ORAL_TABLET | Freq: Every day | ORAL | 3 refills | Status: AC

## 2021-02-18 MED ORDER — ATORVASTATIN CALCIUM 10 MG PO TABS: 10.0000 mg | ORAL_TABLET | Freq: Every day | ORAL | 3 refills | Status: DC

## 2021-02-18 NOTE — Progress Notes (Signed)
New patient visit   Patient: Jeffrey RoanKenneth B Larivee Jr.   DOB: 12/02/1955   65 y.o. Male  MRN: [id number] Visit Date: 02/18/2021  Today's healthcare provider: Dortha Kernennis Emily Massar, PA-C   Chief Complaint  Patient presents with   New Patient (Initial Visit)   Subjective    Jeffrey RoanKenneth B Dahlstrom Jr. is a 65 y.o. male who presents today as a new patient to establish care.   Hypertension  BP Readings from Last 3 Encounters:  02/18/21 140/69  09/18/16 128/76  02/26/16 (!) 158/95   Wt Readings from Last 3 Encounters:  02/18/21 167 lb (75.8 kg)  09/18/16 170 lb (77.1 kg)  04/22/15 169 lb 12.8 oz (77 kg)      He reports good compliance with treatment. He is not having side effects.  He is following a Regular diet. He is not exercising. He does not smoke.  Use of agents associated with hypertension: none.   Outside blood pressures are checked daily. Symptoms: No chest pain No chest pressure  No palpitations No syncope  No dyspnea No orthopnea  No paroxysmal nocturnal dyspnea No lower extremity edema   Pertinent labs: Lab Results  Component Value Date   CHOL 159 04/23/2015   HDL 21 (L) 04/23/2015   LDLCALC Comment 04/23/2015   TRIG 848 (HH) 04/23/2015   CHOLHDL 7.6 (H) 04/23/2015   Lab Results  Component Value Date   NA 135 09/18/2016   K 3.9 09/18/2016   CREATININE 0.96 09/18/2016   GFRNONAA >60 09/18/2016   GFRAA >60 09/18/2016   GLUCOSE 195 (H) 09/18/2016     The ASCVD Risk score (Goff DC Jr., et al., 2013) failed to calculate for the following reasons:   The valid total cholesterol range is 130 to 320 mg/dL   Diabetes Mellitus Type II  Lab Results  Component Value Date   HGBA1C 7.0 (H) 04/23/2015   Wt Readings from Last 3 Encounters:  02/18/21 167 lb (75.8 kg)  09/18/16 170 lb (77.1 kg)  04/22/15 169 lb 12.8 oz (77 kg)   He reports good compliance with treatment. He is not having side effects.  Symptoms: No fatigue No foot ulcerations  No appetite changes No  nausea  No paresthesia of the feet  No polydipsia  No polyuria No visual disturbances   No vomiting     Home blood sugar records: trend: stable  Episodes of hypoglycemia? No    Current insulin regiment: none Most Recent Eye Exam: due Current exercise: no regular exercise Current diet habits: well balanced  Pertinent Labs: Lab Results  Component Value Date   CHOL 159 04/23/2015   HDL 21 (L) 04/23/2015   LDLCALC Comment 04/23/2015   TRIG 848 (HH) 04/23/2015   CHOLHDL 7.6 (H) 04/23/2015   Lab Results  Component Value Date   NA 135 09/18/2016   K 3.9 09/18/2016   CREATININE 0.96 09/18/2016   GFRNONAA >60 09/18/2016   GFRAA >60 09/18/2016   GLUCOSE 195 (H) 09/18/2016      Past Medical History:  Diagnosis Date   Accident 2013   Had accident after taking BP HCTZ    Heartburn    Hyperlipidemia    Hypertension    No past surgical history on file.   Family Status  Relation Name Status   Mother  Deceased at age 66   Father  Deceased at age 66   Mat Aunt  (Not Specified)   Family History  Problem Relation Age of Onset   Hypertension  Maternal Aunt    Kidney disease Maternal Aunt    Social History   Socioeconomic History   Marital status: Single    Spouse name: Not on file   Number of children: Not on file   Years of education: Not on file   Highest education level: Not on file  Occupational History   Not on file  Tobacco Use   Smoking status: Former Smoker    Packs/day: 1.00    Types: Cigarettes    Quit date: 09/21/1976    Years since quitting: 44.4   Smokeless tobacco: Never Used  Substance and Sexual Activity   Alcohol use: No   Drug use: No    Comment: [employer] as young child.    Sexual activity: Not on file  Other Topics Concern   Not on file  Social History Narrative   Not on file   Social Determinants of Health   Financial Resource Strain: Not on file  Food Insecurity: Not on file  Transportation Needs: Not on file  Physical Activity:  Not on file  Stress: Not on file  Social Connections: Not on file   Outpatient Medications Prior to Visit  Medication Sig   amLODipine (NORVASC) 5 MG tablet Take 1 tablet (5 mg total) by mouth daily.   aspirin 81 MG tablet Take 81 mg by mouth daily.   atorvastatin (LIPITOR) 10 MG tablet Take 1 tablet (10 mg total) by mouth daily at 6 PM.   benazepril (LOTENSIN) 40 MG tablet Take 1 tablet (40 mg total) by mouth daily.   Fish Oil-Cholecalciferol (FISH OIL + D3 PO) Take by mouth.   gemfibrozil (LOPID) 600 MG tablet Take 1 tablet (600 mg total) by mouth 2 (two) times daily before a meal.   metFORMIN (GLUCOPHAGE) 1000 MG tablet Take 1 tablet (1,000 mg total) by mouth 2 (two) times daily with a meal.   meloxicam (MOBIC) 15 MG tablet Take 1 tablet (15 mg total) by mouth daily.   Vitamin D, Ergocalciferol, (DRISDOL) 50000 UNITS CAPS capsule Take 50,000 Units by mouth every 7 (seven) days.   No facility-administered medications prior to visit.   Allergies  Allergen Reactions   Hydrochlorothiazide     Immunization History  Administered Date(s) Administered   Moderna Sars-Covid-2 Vaccination 12/25/2019, 01/22/2020, 07/17/2020, 12/30/2020   Pneumococcal Conjugate-13 07/28/2017   Pneumococcal Polysaccharide-23 06/29/2008   Tdap 10/05/2012    Health Maintenance  Topic Date Due   FOOT EXAM  Never done   OPHTHALMOLOGY EXAM  Never done   HIV Screening  Never done   Hepatitis C Screening  Never done   Zoster Vaccines- Shingrix (1 of 2) Never done   HEMOGLOBIN A1C  10/24/2015   PNA vac Low Risk Adult (2 of 2 - PPSV23) 07/05/2020   INFLUENZA VACCINE  04/21/2021   TETANUS/TDAP  10/05/2022   COLONOSCOPY (Pts 45-17yrs Insurance coverage will need to be confirmed)  04/21/2025   COVID-19 Vaccine  Completed   HPV VACCINES  Aged Out    Patient Care Team: Africa Masaki, Jodell Cipro, PA-C as PCP - General (Family Medicine)  Review of Systems  All other systems reviewed and are negative.      Objective    BP 140/69   Pulse 88   Temp 98.6 F (37 C)   Resp 16   Ht 5' 1.5" (1.562 m)   Wt 167 lb (75.8 kg)   BMI 31.04 kg/m  Physical Exam Constitutional:      Appearance: He is well-developed.  HENT:     Head: Normocephalic and atraumatic.     Right Ear: External ear normal.     Left Ear: External ear normal.     Nose: Nose normal.  Eyes:     General:        Right eye: No discharge.     Conjunctiva/sclera: Conjunctivae normal.     Pupils: Pupils are equal, round, and reactive to light.  Neck:     Thyroid: No thyromegaly.     Trachea: No tracheal deviation.  Cardiovascular:     Rate and Rhythm: Normal rate and regular rhythm.     Heart sounds: Normal heart sounds. No murmur heard.   Pulmonary:     Effort: Pulmonary effort is normal. No respiratory distress.     Breath sounds: Normal breath sounds. No wheezing or rales.  Chest:     Chest wall: No tenderness.  Abdominal:     General: There is no distension.     Palpations: Abdomen is soft. There is no mass.     Tenderness: There is no abdominal tenderness. There is no guarding or rebound.  Musculoskeletal:        General: No tenderness. Normal range of motion.     Cervical back: Normal range of motion and neck supple.  Lymphadenopathy:     Cervical: No cervical adenopathy.  Skin:    General: Skin is warm and dry.     Findings: No erythema or rash.  Neurological:     Mental Status: He is alert and oriented to person, place, and time.     Cranial Nerves: No cranial nerve deficit.     Motor: No abnormal muscle tone.     Coordination: Coordination normal.     Deep Tendon Reflexes: Reflexes are normal and symmetric. Reflexes normal.  Psychiatric:        Behavior: Behavior normal.        Thought Content: Thought content normal.        Judgment: Judgment normal.    Diabetic Foot Form - Detailed   Diabetic Foot Exam - detailed Diabetic Foot exam was performed with the following findings: Yes 02/18/2021 11:24 AM   Visual Foot Exam completed.: Yes  Can the patient see the bottom of their feet?: Yes Are the shoes appropriate in style and fit?: Yes Is there swelling or and abnormal foot shape?: No Is there a claw toe deformity?: No Is there elevated skin temparature?: No Is there foot or ankle muscle weakness?: No Normal Range of Motion: Yes Pulse Foot Exam completed.: Yes   Right posterior Tibialias: Present Left posterior Tibialias: Present   Right Dorsalis Pedis: Present Left Dorsalis Pedis: Present  Sensory Foot Exam Completed.: Yes Semmes-Weinstein Monofilament Test R Site 1-Great Toe: Pos L Site 1-Great Toe: Pos          Depression Screen PHQ 2/9 Scores 02/18/2021 04/22/2015  PHQ - 2 Score 0 0  PHQ- 9 Score 0 -   No results found for any visits on 02/18/21.  Assessment & Plan     1. Primary hypertension Good control of BP using the Amlodipine 5 mg qd and Benazewpril 40 mg qd. Needs refill of Amlodipine and follow up labs. - CBC with Differential/Platelet - Comprehensive metabolic panel - amLODipine (NORVASC) 5 MG tablet; Take 1 tablet (5 mg total) by mouth daily.  Dispense: 90 tablet; Refill: 3  2. Type 2 diabetes mellitus without complication, without long-term current use of insulin (HCC) Good control on Metformin 1000 mg  BID and Hgb A1C was 6.5 on 11-08-20. Due for follow up labs and should get annual ophthalmology screening. - Hemoglobin A1c  3. Hyperlipidemia, unspecified hyperlipidemia type Tolerating Atorvastatin without side effects. Recheck labs. Refill medication and continue low fat diet. - Comprehensive metabolic panel - Lipid panel - atorvastatin (LIPITOR) 10 MG tablet; Take 1 tablet (10 mg total) by mouth daily at 6 PM.  Dispense: 90 tablet; Refill: 3  4. Encounter to establish care Moving primary care to [facility] from [place].   No follow-ups on file.     I, Lorrin Nawrot, PA-C, have reviewed all documentation for this visit. The documentation on 02/18/21  for the exam, diagnosis, procedures, and orders are all accurate and complete.    Dortha Kern, PA-C  [facility] [phone] (phone) [phone] (fax)  [facility]

## 2021-02-19 LAB — LIPID PANEL
Chol/HDL Ratio: 5 ratio (ref 0.0–5.0)
Cholesterol, Total: 165 mg/dL (ref 100–199)
HDL: 33 mg/dL — ABNORMAL LOW (ref >39)
LDL Chol Calc (NIH): 104 mg/dL — ABNORMAL HIGH (ref 0–99)
Triglycerides: 155 mg/dL — ABNORMAL HIGH (ref 0–149)
VLDL Cholesterol Cal: 28 mg/dL (ref 5–40)

## 2021-02-19 LAB — CBC WITH DIFFERENTIAL/PLATELET
Basophils Absolute: 0.1 10*3/uL (ref 0.0–0.2)
Basos: 1 %
EOS (ABSOLUTE): 0 10*3/uL (ref 0.0–0.4)
Eos: 1 %
Hematocrit: 44.9 % (ref 37.5–51.0)
Hemoglobin: 15.6 g/dL (ref 13.0–17.7)
Immature Grans (Abs): 0 10*3/uL (ref 0.0–0.1)
Immature Granulocytes: 0 %
Lymphocytes Absolute: 1 10*3/uL (ref 0.7–3.1)
Lymphs: 22 %
MCH: 31.1 pg (ref 26.6–33.0)
MCHC: 34.7 g/dL (ref 31.5–35.7)
MCV: 90 fL (ref 79–97)
Monocytes Absolute: 0.4 10*3/uL (ref 0.1–0.9)
Monocytes: 9 %
Neutrophils Absolute: 3.2 10*3/uL (ref 1.4–7.0)
Neutrophils: 67 %
Platelets: 190 10*3/uL (ref 150–450)
RBC: 5.01 x10E6/uL (ref 4.14–5.80)
RDW: 13.6 % (ref 11.6–15.4)
WBC: 4.7 10*3/uL (ref 3.4–10.8)

## 2021-02-19 LAB — COMPREHENSIVE METABOLIC PANEL
ALT: 45 IU/L — ABNORMAL HIGH (ref 0–44)
AST: 37 IU/L (ref 0–40)
Albumin/Globulin Ratio: 2.7 — ABNORMAL HIGH (ref 1.2–2.2)
Albumin: 5.4 g/dL — ABNORMAL HIGH (ref 3.8–4.8)
Alkaline Phosphatase: 42 IU/L — ABNORMAL LOW (ref 44–121)
BUN/Creatinine Ratio: 21 (ref 10–24)
BUN: 25 mg/dL (ref 8–27)
Bilirubin Total: 0.4 mg/dL (ref 0.0–1.2)
CO2: 19 mmol/L — ABNORMAL LOW (ref 20–29)
Calcium: 10.3 mg/dL — ABNORMAL HIGH (ref 8.6–10.2)
Chloride: 97 mmol/L (ref 96–106)
Creatinine, Ser: 1.17 mg/dL (ref 0.76–1.27)
Globulin, Total: 2 g/dL (ref 1.5–4.5)
Glucose: 154 mg/dL — ABNORMAL HIGH (ref 65–99)
Potassium: 5 mmol/L (ref 3.5–5.2)
Sodium: 136 mmol/L (ref 134–144)
Total Protein: 7.4 g/dL (ref 6.0–8.5)
eGFR: 69 mL/min/{1.73_m2} (ref >59)

## 2021-02-19 LAB — HEMOGLOBIN A1C
Est. average glucose Bld gHb Est-mCnc: 180 mg/dL
Hgb A1c MFr Bld: 7.9 % — ABNORMAL HIGH (ref 4.8–5.6)

## 2021-03-19 ENCOUNTER — Other Ambulatory Visit: Payer: Self-pay | Admitting: Family Medicine

## 2021-03-19 DIAGNOSIS — I10 Essential (primary) hypertension: Secondary | ICD-10-CM

## 2021-03-19 NOTE — Telephone Encounter (Signed)
   Notes to clinic:  script requested was filled by a different provider  Review for refill   Requested Prescriptions  Pending Prescriptions Disp Refills   benazepril (LOTENSIN) 40 MG tablet [Pharmacy Med Name: BENAZEPRIL 40MG  TABLETS] 90 tablet 3    Sig: TAKE 1 TABLET(40 MG) BY MOUTH DAILY      Cardiovascular:  ACE Inhibitors Failed - 03/19/2021  9:23 AM      Failed - Last BP in normal range    BP Readings from Last 1 Encounters:  02/18/21 140/69          Passed - Cr in normal range and within 180 days    Creatinine, Ser  Date Value Ref Range Status  02/18/2021 1.17 0.76 - 1.27 mg/dL Final          Passed - K in normal range and within 180 days    Potassium  Date Value Ref Range Status  02/18/2021 5.0 3.5 - 5.2 mmol/L Final          Passed - Patient is not pregnant      Passed - Valid encounter within last 6 months    Recent Outpatient Visits           4 weeks ago Primary hypertension   02/20/2021, [employer], PA-C   5 years ago Essential hypertension   [facility] MD       Future Appointments             In 3 months Chrismon, Janeann Forehand, PA-C Jodell Cipro, [facility]

## 2021-06-20 ENCOUNTER — Ambulatory Visit: Payer: Medicare Other | Admitting: Family Medicine

## 2021-06-23 ENCOUNTER — Ambulatory Visit: Payer: Medicare Other | Admitting: Family Medicine

## 2021-07-01 ENCOUNTER — Ambulatory Visit (INDEPENDENT_AMBULATORY_CARE_PROVIDER_SITE_OTHER): Payer: Medicare Other | Admitting: Family Medicine

## 2021-07-01 ENCOUNTER — Other Ambulatory Visit: Payer: Self-pay

## 2021-07-01 ENCOUNTER — Encounter: Payer: Self-pay | Admitting: Family Medicine

## 2021-07-01 VITALS — BP 142/62 | HR 68 | Temp 98.7°F | Wt 161.0 lb

## 2021-07-01 DIAGNOSIS — E119 Type 2 diabetes mellitus without complications: Secondary | ICD-10-CM

## 2021-07-01 DIAGNOSIS — I10 Essential (primary) hypertension: Secondary | ICD-10-CM

## 2021-07-01 DIAGNOSIS — E785 Hyperlipidemia, unspecified: Secondary | ICD-10-CM

## 2021-07-01 LAB — POCT GLYCOSYLATED HEMOGLOBIN (HGB A1C): Hemoglobin A1C: 6.6 % — AB (ref 4.0–5.6)

## 2021-07-01 NOTE — Progress Notes (Signed)
Established patient visit   Patient: Jeffrey Mcdonald.   DOB: [birth year]   65 y.o. Male  MRN: [id number] Visit Date: 07/01/2021  Today's healthcare provider: Wilhemena Durie, MD   No chief complaint on file.  Subjective    HPI  Patient comes in today for follow-up.  He has been feeling well.  He is taking medications as prescribed.  Last A1c was 7.9 his today is 6.6. Diabetes Mellitus Type II, follow-up  Lab Results  Component Value Date   HGBA1C 7.9 (H) 02/18/2021   HGBA1C 7.0 (H) 04/23/2015   Last seen for diabetes 5 months ago.  Management since then includes continuing the same treatment, but work on lifestyle changes. He reports excellent compliance with treatment. He is not having side effects.   Home blood sugar records:  fastings mid 100's Episodes of hypoglycemia? No    Current insulin regiment: none Most Recent Eye Exam:   --------------------------------------------------------------------------------------------------- Hypertension, follow-up  BP Readings from Last 3 Encounters:  07/01/21 (!) 142/62  02/18/21 140/69  09/18/16 128/76   Wt Readings from Last 3 Encounters:  07/01/21 161 lb (73 kg)  02/18/21 167 lb (75.8 kg)  09/18/16 170 lb (77.1 kg)     He was last seen for hypertension 5 months ago.  Management since that visit includes no changes. He reports excellent compliance with treatment. He is not having side effects.  Outside blood pressures are normal at home.  He does not smoke.  Use of agents associated with hypertension: none.   --------------------------------------------------------------------------------------------------- Lipid/Cholesterol, follow-up  Last Lipid Panel: Lab Results  Component Value Date   CHOL 165 02/18/2021   LDLCALC 104 (H) 02/18/2021   HDL 33 (L) 02/18/2021   TRIG 155 (H) 02/18/2021    He was last seen for this 5 months ago.  Management since that visit includes no changes.  He reports  excellent compliance with treatment. He is not having side effects.   Symptoms: No appetite changes No foot ulcerations  No chest pain No chest pressure/discomfort  No dyspnea No orthopnea  No fatigue No lower extremity edema  No palpitations No paroxysmal nocturnal dyspnea  No nausea No numbness or tingling of extremity  No polydipsia No polyuria  No speech difficulty No syncope   He is following a Regular diet. Current exercise: walking  Last metabolic panel Lab Results  Component Value Date   GLUCOSE 154 (H) 02/18/2021   NA 136 02/18/2021   K 5.0 02/18/2021   BUN 25 02/18/2021   CREATININE 1.17 02/18/2021   EGFR 69 02/18/2021   GFRNONAA >60 09/18/2016   CALCIUM 10.3 (H) 02/18/2021   AST 37 02/18/2021   ALT 45 (H) 02/18/2021   The 10-year ASCVD risk score (Arnett DK, et al., 2019) is: 34%  ---------------------------------------------------------------------------------------------------     Medications: Outpatient Medications Prior to Visit  Medication Sig   amLODipine (NORVASC) 5 MG tablet Take 1 tablet (5 mg total) by mouth daily.   aspirin 81 MG tablet Take 81 mg by mouth daily.   atorvastatin (LIPITOR) 10 MG tablet Take 1 tablet (10 mg total) by mouth daily at 6 PM.   benazepril (LOTENSIN) 40 MG tablet TAKE 1 TABLET(40 MG) BY MOUTH DAILY   Fish Oil-Cholecalciferol (FISH OIL + D3 PO) Take by mouth.   gemfibrozil (LOPID) 600 MG tablet Take 1 tablet (600 mg total) by mouth 2 (two) times daily before a meal.   metFORMIN (GLUCOPHAGE) 1000 MG tablet Take 1 tablet (  1,000 mg total) by mouth 2 (two) times daily with a meal.   Vitamin D, Ergocalciferol, (DRISDOL) 50000 UNITS CAPS capsule Take 50,000 Units by mouth every 7 (seven) days.   meloxicam (MOBIC) 15 MG tablet Take 1 tablet (15 mg total) by mouth daily.   No facility-administered medications prior to visit.    Review of Systems  Constitutional: Negative.   Respiratory: Negative.    Cardiovascular:  Negative.   Gastrointestinal: Negative.   Skin:  Negative for wound.  Neurological:  Negative for dizziness, weakness, light-headedness, numbness and headaches.      Objective    BP (!) 142/62 (BP Location: Right Arm, Patient Position: Sitting, Cuff Size: Normal)   Pulse 68   Temp 98.7 F (37.1 C) (Oral)   Wt 161 lb (73 kg)   SpO2 100%   BMI 29.93 kg/m    Physical Exam Vitals reviewed.  Constitutional:      Appearance: He is well-developed.  HENT:     Head: Normocephalic and atraumatic.     Right Ear: External ear normal.     Left Ear: External ear normal.     Nose: Nose normal.  Eyes:     General:        Right eye: No discharge.     Conjunctiva/sclera: Conjunctivae normal.     Pupils: Pupils are equal, round, and reactive to light.  Neck:     Thyroid: No thyromegaly.     Trachea: No tracheal deviation.  Cardiovascular:     Rate and Rhythm: Normal rate and regular rhythm.     Heart sounds: Normal heart sounds. No murmur heard. Pulmonary:     Effort: Pulmonary effort is normal. No respiratory distress.     Breath sounds: Normal breath sounds. No wheezing or rales.  Chest:     Chest wall: No tenderness.  Abdominal:     General: There is no distension.     Palpations: Abdomen is soft. There is no mass.     Tenderness: There is no abdominal tenderness. There is no guarding or rebound.  Musculoskeletal:        General: No tenderness.     Cervical back: Normal range of motion and neck supple.  Lymphadenopathy:     Cervical: No cervical adenopathy.  Skin:    General: Skin is warm and dry.     Findings: No erythema or rash.  Neurological:     Mental Status: He is alert and oriented to person, place, and time.     Cranial Nerves: No cranial nerve deficit.     Motor: No abnormal muscle tone.     Coordination: Coordination normal.     Deep Tendon Reflexes: Reflexes are normal and symmetric. Reflexes normal.  Psychiatric:        Mood and Affect: Mood normal.         Behavior: Behavior normal.        Thought Content: Thought content normal.        Judgment: Judgment normal.      No results found for any visits on 07/01/21.  Assessment & Plan     1. Primary hypertension Pressure under fair control on amlodipine and benazepril.  Considering increasing clozapine to higher dose in the future  2. Type 2 diabetes mellitus without complication, without long-term current use of insulin (HCC) Much improved on metformin.  A1c under good control at 6.6 today. - POCT glycosylated hemoglobin (Hb A1C)  3. Hyperlipidemia, unspecified hyperlipidemia type On atorvastatin 10.  Also on gemfibrozil.  Will consider stopping the gemfibrozil in the future when not using less triglycerides stay above 500 off of it. No follow-ups on file.      I, Wilhemena Durie, MD, have reviewed all documentation for this visit. The documentation on 07/12/21 for the exam, diagnosis, procedures, and orders are all accurate and complete.    Charistopher Rumble Cranford Mon, MD  [facility] [phone] (phone) [phone] (fax)  [place]

## 2021-09-02 ENCOUNTER — Other Ambulatory Visit: Payer: Self-pay | Admitting: Family Medicine

## 2021-09-02 DIAGNOSIS — E785 Hyperlipidemia, unspecified: Secondary | ICD-10-CM

## 2021-09-23 ENCOUNTER — Other Ambulatory Visit: Payer: Self-pay | Admitting: Physician Assistant

## 2021-09-23 DIAGNOSIS — E119 Type 2 diabetes mellitus without complications: Secondary | ICD-10-CM

## 2021-09-23 NOTE — Telephone Encounter (Signed)
Copied from [place] [phone]. Topic: Quick Communication - Rx Refill/Question >> Sep 23, 2021 12:38 PM Leward Quan A wrote: Medication: metFORMIN (GLUCOPHAGE) 1000 MG tablet   Has the patient contacted their pharmacy? Yes.  Told to call office  (Agent: If no, request that the patient contact the pharmacy for the refill. If patient does not wish to contact the pharmacy document the reason why and proceed with request.) (Agent: If yes, when and what did the pharmacy advise?)  Preferred Pharmacy (with phone number or street name): [facility] [place], [address]  Phone:  [phone] Fax:  [phone]    Has the patient been seen for an appointment in the last year OR does the patient have an upcoming appointment? Yes.    Agent: Please be advised that RX refills may take up to 3 business days. We ask that you follow-up with your pharmacy.

## 2021-09-24 MED ORDER — METFORMIN HCL 1000 MG PO TABS: 1000.0000 mg | ORAL_TABLET | Freq: Two times a day (BID) | ORAL | 1 refills | Status: AC

## 2021-09-24 NOTE — Telephone Encounter (Signed)
Requested medication (s) are due for refill today Yes  Requested medication (s) are on the active medication list Yes  Future visit scheduled for 10/31/21.  LOV 11/01/20. Routing to provider for review due to last prescribed by other-historical.    Requested Prescriptions  Pending Prescriptions Disp Refills   metFORMIN (GLUCOPHAGE) 1000 MG tablet 180 tablet 3    Sig: Take 1 tablet (1,000 mg total) by mouth 2 (two) times daily with a meal.     Endocrinology:  Diabetes - Biguanides Passed - 09/23/2021  6:58 PM      Passed - Cr in normal range and within 360 days    Creatinine, Ser  Date Value Ref Range Status  02/18/2021 1.17 0.76 - 1.27 mg/dL Final          Passed - HBA1C is between 0 and 7.9 and within 180 days    Hemoglobin A1C  Date Value Ref Range Status  07/01/2021 6.6 (A) 4.0 - 5.6 % Final    Comment:    Average 143   Hgb A1c MFr Bld  Date Value Ref Range Status  02/18/2021 7.9 (H) 4.8 - 5.6 % Final    Comment:             Prediabetes: 5.7 - 6.4          Diabetes: >6.4          Glycemic control for adults with diabetes: <7.0           Passed - eGFR in normal range and within 360 days    GFR calc Af Amer  Date Value Ref Range Status  09/18/2016 >60 >60 mL/min Final    Comment:    (NOTE) The eGFR has been calculated using the CKD EPI equation. This calculation has not been validated in all clinical situations. eGFR's persistently <60 mL/min signify possible Chronic Kidney Disease.    GFR calc non Af Amer  Date Value Ref Range Status  09/18/2016 >60 >60 mL/min Final   eGFR  Date Value Ref Range Status  02/18/2021 69 >59 mL/min/1.73 Final          Passed - Valid encounter within last 6 months    Recent Outpatient Visits           2 months ago Primary hypertension   [facility] Jerrol Banana., MD   7 months ago Primary hypertension   [facility], Vickki Muff, PA-C   6 years ago Essential hypertension   [facility] Arlis Porta., MD       Future Appointments             In 1 month Thedore Mins, Ria Comment, PA-C [facility], [facility]

## 2021-10-30 NOTE — Progress Notes (Signed)
Established patient visit   Patient: Jeffrey Mcdonald.   DOB: Jun 02, 1956   66 y.o. Male  MRN: [id number] Visit Date: 10/31/2021  Today's healthcare provider: Mikey Kirschner, PA-C   Cc. Htn, dm, lipid f/u  Subjective    HPI  Diabetes Mellitus Type II, follow-up  Lab Results  Component Value Date   HGBA1C 6.6 (A) 07/01/2021   HGBA1C 7.9 (H) 02/18/2021   HGBA1C 7.0 (H) 04/23/2015   Last seen for diabetes 4 months ago.  Management since then includes continuing the same treatment. He reports good compliance with treatment. He is not having side effects.   Home blood sugar records:  120-130  Episodes of hypoglycemia? No    Current insulin regiment: none Most Recent Eye Exam: scheduled for next month Started exercising, 10 min every other day on the treadmill --------------------------------------------------------------------------------------------------- Hypertension, follow-up  BP Readings from Last 3 Encounters:  10/31/21 138/61  07/01/21 (!) 142/62  02/18/21 140/69   Wt Readings from Last 3 Encounters:  10/31/21 157 lb (71.2 kg)  07/01/21 161 lb (73 kg)  02/18/21 167 lb (75.8 kg)     He was last seen for hypertension 4 months ago.  BP at that visit was 142/62. Management since that visit includes under fair control on amlodipine and benazepril.  Considering increasing clozapine to higher dose in the future. He reports good compliance with treatment. He is not having side effects.  He is exercising. He is adherent to low salt diet.   Outside blood pressures are 110-120 over 70-80.  He does not smoke.  Use of agents associated with hypertension: none.   --------------------------------------------------------------------------------------------------- Lipid/Cholesterol, follow-up  Last Lipid Panel: Lab Results  Component Value Date   CHOL 165 02/18/2021   LDLCALC 104 (H) 02/18/2021   HDL 33 (L) 02/18/2021   TRIG 155 (H) 02/18/2021    He  was last seen for this 4 months ago.  Management since that visit includes On atorvastatin 10.  Also on gemfibrozil.  Will consider stopping the gemfibrozil in the future when not using less triglycerides stay above 500 off of it.  He reports good compliance with treatment. He is not having side effects.   Symptoms: No appetite changes No foot ulcerations  No chest pain No chest pressure/discomfort  No dyspnea No orthopnea  No fatigue No lower extremity edema  No palpitations No paroxysmal nocturnal dyspnea  No nausea No numbness or tingling of extremity  No polydipsia No polyuria  No speech difficulty No syncope    Last metabolic panel Lab Results  Component Value Date   GLUCOSE 154 (H) 02/18/2021   NA 136 02/18/2021   K 5.0 02/18/2021   BUN 25 02/18/2021   CREATININE 1.17 02/18/2021   EGFR 69 02/18/2021   GFRNONAA >60 09/18/2016   CALCIUM 10.3 (H) 02/18/2021   AST 37 02/18/2021   ALT 45 (H) 02/18/2021   The 10-year ASCVD risk score (Arnett DK, et al., 2019) is: 34.6%  ---------------------------------------------------------------------------------------------------   Medications: Outpatient Medications Prior to Visit  Medication Sig   amLODipine (NORVASC) 5 MG tablet Take 1 tablet (5 mg total) by mouth daily.   aspirin 81 MG tablet Take 81 mg by mouth daily.   atorvastatin (LIPITOR) 10 MG tablet Take 1 tablet (10 mg total) by mouth daily at 6 PM.   benazepril (LOTENSIN) 40 MG tablet TAKE 1 TABLET(40 MG) BY MOUTH DAILY   Fish Oil-Cholecalciferol (FISH OIL + D3 PO) Take by mouth.  gemfibrozil (LOPID) 600 MG tablet TAKE 1 TABLET(600 MG) BY MOUTH TWICE DAILY   metFORMIN (GLUCOPHAGE) 1000 MG tablet Take 1 tablet (1,000 mg total) by mouth 2 (two) times daily with a meal.   [DISCONTINUED] meloxicam (MOBIC) 15 MG tablet Take 1 tablet (15 mg total) by mouth daily.   [DISCONTINUED] Vitamin D, Ergocalciferol, (DRISDOL) 50000 UNITS CAPS capsule Take 50,000 Units by mouth every  7 (seven) days.   No facility-administered medications prior to visit.    Review of Systems  Constitutional:  Negative for fatigue and fever.  Respiratory:  Negative for cough and shortness of breath.   Cardiovascular:  Negative for chest pain, palpitations and leg swelling.  Neurological:  Negative for dizziness and headaches.      Objective    BP 138/61 (BP Location: Right Arm, Patient Position: Sitting, Cuff Size: Normal)    Pulse 60    Temp 98.7 F (37.1 C) (Oral)    Wt 157 lb (71.2 kg)    SpO2 100%    BMI 29.18 kg/m   Physical Exam Constitutional:      General: He is awake.     Appearance: He is well-developed.  HENT:     Head: Normocephalic.  Eyes:     Conjunctiva/sclera: Conjunctivae normal.  Cardiovascular:     Rate and Rhythm: Normal rate and regular rhythm.     Heart sounds: Normal heart sounds.  Pulmonary:     Effort: Pulmonary effort is normal.     Breath sounds: Normal breath sounds.  Musculoskeletal:     Right lower leg: No edema.     Left lower leg: No edema.  Skin:    General: Skin is warm.  Neurological:     Mental Status: He is alert and oriented to person, place, and time.  Psychiatric:        Attention and Perception: Attention normal.        Mood and Affect: Mood normal.        Speech: Speech normal.        Behavior: Behavior is cooperative.    No results found for any visits on 10/31/21.  Assessment & Plan     Problem List Items Addressed This Visit       Cardiovascular and Mediastinum   Hypertension associated with diabetes ([place])    Well controlled today. Pt started exercising on a treadmill for 15 min every other day.  Continue meds      Relevant Orders   Comprehensive Metabolic Panel (CMET)     Endocrine   Hyperlipidemia associated with type 2 diabetes mellitus ([place])    May need better control, LDL goal < 70. Pt has included more exercise, we will recheck lipids and cmp      Relevant Orders   Lipid Profile   Type 2 diabetes  mellitus without complication, without long-term current use of insulin ([facility]) - Primary    Last A1C 6.6 on Metformin 1000 BID Will recheck A1c       Relevant Orders   HgB A1c   Other Visit Diagnoses     Encounter for hepatitis C screening test for low risk patient       Relevant Orders   Hepatitis C antibody   Avitaminosis D       Relevant Orders   Vitamin D (25 hydroxy)        Return in about 4 months (around 02/28/2022) for CPE.      I, Mikey Kirschner, PA-C have reviewed all documentation  for this visit. The documentation on  10/31/2021 for the exam, diagnosis, procedures, and orders are all accurate and complete.    Mikey Kirschner, PA-C  [facility] [phone] (phone) [phone] (fax)  [place]

## 2021-10-31 ENCOUNTER — Encounter: Payer: Self-pay | Admitting: Physician Assistant

## 2021-10-31 ENCOUNTER — Ambulatory Visit (INDEPENDENT_AMBULATORY_CARE_PROVIDER_SITE_OTHER): Payer: Medicare Other | Admitting: Physician Assistant

## 2021-10-31 ENCOUNTER — Other Ambulatory Visit: Payer: Self-pay

## 2021-10-31 VITALS — BP 138/61 | HR 60 | Temp 98.7°F | Wt 157.0 lb

## 2021-10-31 DIAGNOSIS — E785 Hyperlipidemia, unspecified: Secondary | ICD-10-CM

## 2021-10-31 DIAGNOSIS — E1159 Type 2 diabetes mellitus with other circulatory complications: Secondary | ICD-10-CM | POA: Diagnosis not present

## 2021-10-31 DIAGNOSIS — E119 Type 2 diabetes mellitus without complications: Secondary | ICD-10-CM

## 2021-10-31 DIAGNOSIS — Z1159 Encounter for screening for other viral diseases: Secondary | ICD-10-CM

## 2021-10-31 DIAGNOSIS — E1169 Type 2 diabetes mellitus with other specified complication: Secondary | ICD-10-CM

## 2021-10-31 DIAGNOSIS — E559 Vitamin D deficiency, unspecified: Secondary | ICD-10-CM

## 2021-10-31 DIAGNOSIS — I152 Hypertension secondary to endocrine disorders: Secondary | ICD-10-CM

## 2021-10-31 NOTE — Assessment & Plan Note (Signed)
Well controlled today. Pt started exercising on a treadmill for 15 min every other day.  Continue meds

## 2021-10-31 NOTE — Assessment & Plan Note (Signed)
May need better control, LDL goal < 70. Pt has included more exercise, we will recheck lipids and cmp

## 2021-10-31 NOTE — Assessment & Plan Note (Signed)
Last A1C 6.6 on Metformin 1000 BID Will recheck A1c

## 2021-11-01 LAB — LIPID PANEL
Chol/HDL Ratio: 4.7 ratio (ref 0.0–5.0)
Cholesterol, Total: 155 mg/dL (ref 100–199)
HDL: 33 mg/dL — ABNORMAL LOW (ref >39)
LDL Chol Calc (NIH): 95 mg/dL (ref 0–99)
Triglycerides: 153 mg/dL — ABNORMAL HIGH (ref 0–149)
VLDL Cholesterol Cal: 27 mg/dL (ref 5–40)

## 2021-11-01 LAB — COMPREHENSIVE METABOLIC PANEL
ALT: 34 IU/L (ref 0–44)
AST: 38 IU/L (ref 0–40)
Albumin/Globulin Ratio: 2.3 — ABNORMAL HIGH (ref 1.2–2.2)
Albumin: 5.4 g/dL — ABNORMAL HIGH (ref 3.8–4.8)
Alkaline Phosphatase: 43 IU/L — ABNORMAL LOW (ref 44–121)
BUN/Creatinine Ratio: 17 (ref 10–24)
BUN: 18 mg/dL (ref 8–27)
Bilirubin Total: 0.5 mg/dL (ref 0.0–1.2)
CO2: 21 mmol/L (ref 20–29)
Calcium: 10.8 mg/dL — ABNORMAL HIGH (ref 8.6–10.2)
Chloride: 101 mmol/L (ref 96–106)
Creatinine, Ser: 1.07 mg/dL (ref 0.76–1.27)
Globulin, Total: 2.3 g/dL (ref 1.5–4.5)
Glucose: 139 mg/dL — ABNORMAL HIGH (ref 70–99)
Potassium: 5.6 mmol/L — ABNORMAL HIGH (ref 3.5–5.2)
Sodium: 138 mmol/L (ref 134–144)
Total Protein: 7.7 g/dL (ref 6.0–8.5)
eGFR: 77 mL/min/{1.73_m2} (ref >59)

## 2021-11-01 LAB — VITAMIN D 25 HYDROXY (VIT D DEFICIENCY, FRACTURES): Vit D, 25-Hydroxy: 44.6 ng/mL (ref 30.0–100.0)

## 2021-11-01 LAB — HEMOGLOBIN A1C
Est. average glucose Bld gHb Est-mCnc: 143 mg/dL
Hgb A1c MFr Bld: 6.6 % — ABNORMAL HIGH (ref 4.8–5.6)

## 2021-11-01 LAB — HEPATITIS C ANTIBODY: Hep C Virus Ab: <0.1 s/co ratio (ref 0.0–0.9)

## 2021-11-03 ENCOUNTER — Telehealth: Payer: Self-pay | Admitting: Physician Assistant

## 2021-11-03 NOTE — Telephone Encounter (Signed)
Pt is calling back with questions regarding his labs. Please advise CB- [phone]

## 2021-12-16 ENCOUNTER — Telehealth: Payer: Self-pay | Admitting: Physician Assistant

## 2021-12-16 NOTE — Telephone Encounter (Signed)
Patient declined the Medicare Wellness Visit with [facility] .  Does not feel it needs this at this time.   Understands that this could benefit him, but wants to decline until he feels he needs it completed.  ?

## 2021-12-16 NOTE — Telephone Encounter (Signed)
Copied from CRM [id number]. Topic: Medicare AWV ?>> Dec 16, 2021  1:28 PM Claudette Laws R wrote: ?Reason for CRM:  ?Left message for patient to call back and schedule Medicare Annual Wellness Visit (AWV) in office.  ? ?If not able to come in the office, please offer to do virtually or by telephone.  ? ?No hx of AWV - AWV-I eligible per [facility] as of 06/21/2021  ? ?Please schedule at anytime with [facility] Advisor.  ? ?45 minute appointment ? ?Any questions, please contact me at [phone] ?

## 2022-02-06 ENCOUNTER — Encounter: Payer: Medicare Other | Admitting: Physician Assistant

## 2022-03-09 ENCOUNTER — Other Ambulatory Visit: Payer: Self-pay | Admitting: Physician Assistant

## 2022-03-09 ENCOUNTER — Telehealth: Payer: Self-pay | Admitting: Physician Assistant

## 2022-03-09 DIAGNOSIS — E785 Hyperlipidemia, unspecified: Secondary | ICD-10-CM

## 2022-03-09 MED ORDER — ATORVASTATIN CALCIUM 10 MG PO TABS: 10.0000 mg | ORAL_TABLET | Freq: Every day | ORAL | 0 refills | Status: AC

## 2022-03-09 NOTE — Telephone Encounter (Signed)
[facility] pharmacy faxed refill request for the following medications:   atorvastatin (LIPITOR) 10 MG tablet     Please advise  

## 2022-06-18 ENCOUNTER — Telehealth: Payer: Self-pay | Admitting: Physician Assistant

## 2022-06-18 ENCOUNTER — Other Ambulatory Visit: Payer: Self-pay | Admitting: Physician Assistant

## 2022-06-18 DIAGNOSIS — E785 Hyperlipidemia, unspecified: Secondary | ICD-10-CM

## 2022-06-18 MED ORDER — GEMFIBROZIL 600 MG PO TABS: ORAL_TABLET | ORAL | 0 refills | Status: DC

## 2022-06-18 NOTE — Telephone Encounter (Signed)
[facility] rx sent. Patient was advised to return around 02/28/22 after last office visit on 10/31/21 and has no future appointment schedueld

## 2022-06-18 NOTE — Telephone Encounter (Signed)
[facility] faxed refill request for the following medications:  gemfibrozil (LOPID) 600 MG tablet   Please advise
